# Patient Record
Sex: Female | Born: 2000 | Race: White | Hispanic: No | Marital: Single | Smoking: Never smoker
Health system: Southern US, Community
[De-identification: ages and names within clinical notes are randomized; demographics above are authoritative.]

## PROBLEM LIST (undated history)

## (undated) DIAGNOSIS — J45909 Unspecified asthma, uncomplicated: Secondary | ICD-10-CM

---
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
## 2020-01-23 DIAGNOSIS — N915 Oligomenorrhea, unspecified: Secondary | ICD-10-CM | POA: Diagnosis not present

## 2020-01-28 ENCOUNTER — Ambulatory Visit: Payer: Medicaid Other

## 2020-01-31 ENCOUNTER — Ambulatory Visit (INDEPENDENT_AMBULATORY_CARE_PROVIDER_SITE_OTHER): Payer: Medicaid Other | Admitting: Family Medicine

## 2020-01-31 ENCOUNTER — Other Ambulatory Visit: Payer: Self-pay

## 2020-01-31 ENCOUNTER — Other Ambulatory Visit (HOSPITAL_COMMUNITY)
Admission: RE | Admit: 2020-01-31 | Discharge: 2020-01-31 | Disposition: A | Discharge status: Home / Self Care | Payer: Medicaid Other | Source: Ambulatory Visit | Attending: Family Medicine | Admitting: Family Medicine

## 2020-01-31 VITALS — BP 98/58 | HR 94 | Ht 65.0 in | Wt 111.0 lb

## 2020-01-31 DIAGNOSIS — Z7251 High risk heterosexual behavior: Secondary | ICD-10-CM

## 2020-01-31 DIAGNOSIS — Z1159 Encounter for screening for other viral diseases: Secondary | ICD-10-CM

## 2020-01-31 DIAGNOSIS — Z113 Encounter for screening for infections with a predominantly sexual mode of transmission: Secondary | ICD-10-CM | POA: Insufficient documentation

## 2020-01-31 DIAGNOSIS — R300 Dysuria: Secondary | ICD-10-CM

## 2020-01-31 LAB — POCT URINALYSIS DIP (MANUAL ENTRY)
Bilirubin, UA: NEGATIVE
Blood, UA: NEGATIVE
Glucose, UA: NEGATIVE mg/dL
Ketones, POC UA: NEGATIVE mg/dL
Leukocytes, UA: NEGATIVE
Nitrite, UA: NEGATIVE
Protein Ur, POC: NEGATIVE mg/dL
Spec Grav, UA: >=1.03 — AB (ref 1.010–1.025)
Urobilinogen, UA: 0.2 E.U./dL
pH, UA: 5.5 (ref 5.0–8.0)

## 2020-01-31 LAB — POCT WET PREP (WET MOUNT)
Clue Cells Wet Prep Whiff POC: NEGATIVE
Trichomonas Wet Prep HPF POC: ABSENT

## 2020-01-31 LAB — POCT URINE PREGNANCY: Preg Test, Ur: NEGATIVE

## 2020-01-31 NOTE — Assessment & Plan Note (Addendum)
Wet prep is negative for trichomonas.  Gonorrhea and chlamydia pending.  She denied oral and anal swabs.  HIV and RPR also ordered today.  We'll follow up on these results.  Could consider PrEP in the future if she is interested she is engaging in high-risk sexual behavior.

## 2020-01-31 NOTE — Assessment & Plan Note (Signed)
Pregnancy test is negative today.  Advised patient that this does not rule out pregnancy especially from her unprotected intercourse this morning.  Given a bag of condoms before leaving.  Advised her to follow-up if she wants to discuss forms of contraception, especially to do when she is on her period to ensure that she is not pregnant.  Advised her that she can follow-up for Plan B at the pharmacy if she would like for emergency contraception.

## 2020-01-31 NOTE — Progress Notes (Signed)
SUBJECTIVE:   CHIEF COMPLAINT / HPI:   Dysuria Pain urinating started 2 weeks ago. Pain is: burning at the end of urination Medications tried: none Any antibiotics in the last 30 days: none More than 3 UTIs in the last 12 months: no STD exposure: unsure, wants to be tested Possibly pregnant: she is having unprotected intercourse, last this morning LMP 10/8  Symptoms Urgency: none Frequency: none Blood in urine: none Pain in back: none Fever: none Vaginal discharge: normal Mouth Ulcers: none  Swab for G/C, Wet prep Declines oral and anal swab Wants HIV/RPR    PERTINENT  PMH / PSH: Hx Depressed mood  OBJECTIVE:   BP (!) 98/58   Pulse 94   Ht 5\' 5"  (1.651 m)   Wt 111 lb (50.3 kg)   LMP 01/10/2020   SpO2 97%   BMI 18.47 kg/m    Physical Exam:  General: 19 y.o. female in NAD Lungs: No increased WOB on RA Abdomen: Soft, non-tender to palpation Skin: warm and dry Extremities: No edema GU: Pelvic exam performed with patient supine.  Chaperone in room.  Bilateral labia without abnormalities, no inguinal LAD palpated.  Cervix exhibits white, thin discharge, no cervix abnormalities.  No vaginal lesions.  Vaginal discharge thin, white/yellow.    Results for orders placed or performed in visit on 01/31/20 (from the past 24 hour(s))  POCT urinalysis dipstick     Status: Abnormal   Collection Time: 01/31/20  9:20 AM  Result Value Ref Range   Color, UA yellow yellow   Clarity, UA clear clear   Glucose, UA negative negative mg/dL   Bilirubin, UA negative negative   Ketones, POC UA negative negative mg/dL   Spec Grav, UA 02/02/20 (A) 1.010 - 1.025   Blood, UA negative negative   pH, UA 5.5 5.0 - 8.0   Protein Ur, POC negative negative mg/dL   Urobilinogen, UA 0.2 0.2 or 1.0 E.U./dL   Nitrite, UA Negative Negative   Leukocytes, UA Negative Negative  POCT urine pregnancy     Status: None   Collection Time: 01/31/20  9:20 AM  Result Value Ref Range   Preg Test,  Ur Negative Negative  POCT Wet Prep 02/02/20 [place])     Status: None   Collection Time: 01/31/20  9:25 AM  Result Value Ref Range   Source Wet Prep POC VAG    WBC, Wet Prep HPF POC 0-3    Bacteria Wet Prep HPF POC Few Few   Clue Cells Wet Prep HPF POC None None   Clue Cells Wet Prep Whiff POC Negative Whiff    Yeast Wet Prep HPF POC None None   KOH Wet Prep POC None None   Trichomonas Wet Prep HPF POC Absent Absent     ASSESSMENT/PLAN:   Dysuria Nonspecific symptoms. Does not have any signs of systemic infection.  Given that her symptoms are nonspecific and her UA is negative, will not treat for UTI.  She was given return precautions including worsening in her pain, no improvement in her pain, development of fever, development of CVA tenderness.  She voiced understanding.  Screen for sexually transmitted diseases Wet prep is negative for trichomonas.  Gonorrhea and chlamydia pending.  She denied oral and anal swabs.  HIV and RPR also ordered today.  We'll follow up on these results.  Could consider PrEP in the future if she is interested she is engaging in high-risk sexual behavior.  Unprotected sexual intercourse Pregnancy test is negative  today.  Advised patient that this does not rule out pregnancy especially from her unprotected intercourse this morning.  Given a bag of condoms before leaving.  Advised her to follow-up if she wants to discuss forms of contraception, especially to do when she is on her period to ensure that she is not pregnant.  Advised her that she can follow-up for Plan B at the pharmacy if she would like for emergency contraception.     Unknown Jim, DO [facility]

## 2020-01-31 NOTE — Patient Instructions (Addendum)
Thank you for coming to see me today. It was a pleasure. Today we talked about:   We performed STD testing today. This will take a few days to come back. If your MyChart is activated, we will message you on there if everything is normal, otherwise we will call. If we need to treat something we will also call you. If you do not hear from [place] in the next 4 days, please give [place] a call.  Please remember to use protection and come back if you want birth control (preferably when you start your period).  Plan B is available over the counter if you would like to get this.  If you have any questions or concerns, please do not hesitate to call the office at [phone].  Best,   Luis Abed, DO

## 2020-01-31 NOTE — Assessment & Plan Note (Signed)
Nonspecific symptoms. Does not have any signs of systemic infection.  Given that her symptoms are nonspecific and her UA is negative, will not treat for UTI.  She was given return precautions including worsening in her pain, no improvement in her pain, development of fever, development of CVA tenderness.  She voiced understanding.

## 2020-02-01 ENCOUNTER — Other Ambulatory Visit: Payer: Self-pay | Admitting: Family Medicine

## 2020-02-01 DIAGNOSIS — A749 Chlamydial infection, unspecified: Secondary | ICD-10-CM

## 2020-02-01 LAB — CERVICOVAGINAL ANCILLARY ONLY
Chlamydia: POSITIVE — AB
Comment: NEGATIVE
Comment: NORMAL
Neisseria Gonorrhea: NEGATIVE

## 2020-02-01 MED ORDER — DOXYCYCLINE HYCLATE 100 MG PO TABS: 100.0000 mg | ORAL_TABLET | Freq: Two times a day (BID) | ORAL | 0 refills | Status: DC

## 2020-02-01 NOTE — Progress Notes (Signed)
rx for doxy

## 2020-02-04 ENCOUNTER — Telehealth: Payer: Self-pay

## 2020-02-04 NOTE — Telephone Encounter (Signed)
Noted and agree. 

## 2020-02-04 NOTE — Telephone Encounter (Signed)
Patient calls nurse line regarding results from recent office visit. Informed patient that results came back positive for chlamydia and that treatment had been sent into pharmacy. Instructed patient to abstain from sexual intercourse, to inform partner of positive results and to follow up in three months for TOC.   To PCP  Veronda Prude, RN

## 2020-02-07 ENCOUNTER — Telehealth: Payer: Self-pay

## 2020-02-07 ENCOUNTER — Other Ambulatory Visit: Payer: Self-pay | Admitting: Family Medicine

## 2020-02-07 DIAGNOSIS — A749 Chlamydial infection, unspecified: Secondary | ICD-10-CM

## 2020-02-07 MED ORDER — AZITHROMYCIN 500 MG PO TABS: 1000.0000 mg | ORAL_TABLET | Freq: Once | ORAL | 0 refills | Status: AC

## 2020-02-07 NOTE — Progress Notes (Signed)
Could not tolerate doxycycline due to diarrhea.  Azithromycin prescribed.

## 2020-02-07 NOTE — Telephone Encounter (Signed)
Patient calls nurse line regarding vomiting related to doxycycline. Patient is requesting one time dosage of azithromycin. Patient states that this has worked well for her in the past.   To PCP  Please advise  Veronda Prude, RN

## 2020-02-07 NOTE — Telephone Encounter (Signed)
Rx sent for azithromycin.

## 2020-02-08 ENCOUNTER — Telehealth: Payer: Self-pay

## 2020-02-08 NOTE — Telephone Encounter (Signed)
-----   Message from Unknown Jim, DO sent at 02/01/2020  4:11 PM EDT ----- Attempted to call patient, no answer, left VM to call back.  Rx sent for doxy.  Team, can we make sure this is reported to health dept?

## 2020-02-08 NOTE — Telephone Encounter (Signed)
Form filled out. Put up front to be faxed. Sunday Spillers, CMA

## 2020-02-12 ENCOUNTER — Encounter: Payer: Self-pay | Admitting: Family Medicine

## 2020-05-23 ENCOUNTER — Ambulatory Visit: Payer: Medicaid Other | Admitting: Family Medicine

## 2020-05-23 NOTE — Progress Notes (Deleted)
    SUBJECTIVE:   CHIEF COMPLAINT / HPI:   Contraceptive Counseling Sexually active: *** LMP: *** Intercourse in the last 2 weeks: *** Attempting to conceive: *** Requests STI testing: *** History of STI: *** Last Pap: N/A given age Personal or family history of DVT/PE: ***   PERTINENT  PMH / PSH: ***  OBJECTIVE:   There were no vitals taken for this visit.   Physical Exam: General: In NAD Respiratory: Breathing comfortably on room air GU: Pelvic exam performed with patient supine.  Chaperone in room.  Bilateral labia without abnormalities.  Cervix exhibits *** discharge, no cervix abnormalities.  No vaginal lesions.  Vaginal discharge ***.   ASSESSMENT/PLAN:   No problem-specific Assessment & Plan notes found for this encounter.     Unknown Jim, DO [facility]

## 2020-05-27 NOTE — Progress Notes (Unsigned)
    SUBJECTIVE:   CHIEF COMPLAINT / HPI:   Contraceptive Counseling Sexually active: yes, she is with a new partner LMP: Patient's last menstrual period was 05/12/2020. Intercourse in the last 2 weeks: yes Attempting to conceive: no Requests STI testing: yes History of STI: yes, chlamydia Last Pap: N/A given age Personal or family history of DVT/PE: no Has previously had IUD Wanted to start pills before, but never did Has had unprotected sex in last 72 hrs and would like emergency contraception, does not want IUD again   PERTINENT  PMH / PSH: Hx depressed mood  OBJECTIVE:   BP (!) 96/58   Pulse 66   Wt 104 lb (47.2 kg)   LMP 05/12/2020   SpO2 94%   BMI 17.31 kg/m    Physical Exam: General: In NAD Respiratory: Breathing comfortably on room air GU: Pelvic exam performed with patient supine.  Chaperone in room.  Bilateral labia without abnormalities.  Cervix exhibits no discharge, no cervix abnormalities.  No vaginal lesions.  Vaginal discharge scant white/yellow, thin.   ASSESSMENT/PLAN:   Encounter for initial prescription of contraceptive pills The risks and benefits were dicussed with patient regarding the following forms of birth control: oral contraceptive pills as she had previously been counseled on all methods.  The patient decided to proceed with OCPs.  She was counseled on possible side effects of this method and will come back in 2-4 weeks for a urine pregnancy test to ensure no pregnancy.  She denies a history of migraines with aura, DVT, PE, and family history of DVT or PE.   Screening for STDs (sexually transmitted diseases) Throat and vagina swabbed for G/C today.  Trichomonas also added to G/C swab from vagina.  HIV/RPR obtained.  Will f/u results.  Recommended condom use.  Emergency contraception Has had unprotected intercourse in last 72 hrs, therefore offered emergency contraception, which patient agreed to.  Rx sent for Plan B.  Advised that she can  start birth control pills immediately with this method, but can take OCP starting tomorrow.  Did advise backup contraception within the next 7 days and condom use indefinitely to prevent STDs.     Unknown Jim, DO [facility]

## 2020-05-28 ENCOUNTER — Other Ambulatory Visit: Payer: Self-pay

## 2020-05-28 ENCOUNTER — Encounter: Payer: Self-pay | Admitting: Family Medicine

## 2020-05-28 ENCOUNTER — Other Ambulatory Visit (HOSPITAL_COMMUNITY)
Admission: RE | Admit: 2020-05-28 | Discharge: 2020-05-28 | Disposition: A | Discharge status: Home / Self Care | Payer: Medicaid Other | Source: Ambulatory Visit | Attending: Family Medicine | Admitting: Family Medicine

## 2020-05-28 ENCOUNTER — Ambulatory Visit (INDEPENDENT_AMBULATORY_CARE_PROVIDER_SITE_OTHER): Payer: Medicaid Other | Admitting: Family Medicine

## 2020-05-28 VITALS — BP 96/58 | HR 66 | Wt 104.0 lb

## 2020-05-28 DIAGNOSIS — N898 Other specified noninflammatory disorders of vagina: Secondary | ICD-10-CM | POA: Diagnosis not present

## 2020-05-28 DIAGNOSIS — Z30011 Encounter for initial prescription of contraceptive pills: Secondary | ICD-10-CM | POA: Diagnosis not present

## 2020-05-28 DIAGNOSIS — Z789 Other specified health status: Secondary | ICD-10-CM | POA: Diagnosis not present

## 2020-05-28 DIAGNOSIS — Z30012 Encounter for prescription of emergency contraception: Secondary | ICD-10-CM | POA: Insufficient documentation

## 2020-05-28 DIAGNOSIS — Z113 Encounter for screening for infections with a predominantly sexual mode of transmission: Secondary | ICD-10-CM | POA: Insufficient documentation

## 2020-05-28 LAB — POCT URINE PREGNANCY: Preg Test, Ur: NEGATIVE

## 2020-05-28 MED ORDER — LEVONORGESTREL 1.5 MG PO TABS: 1.5000 mg | ORAL_TABLET | Freq: Once | ORAL | 0 refills | Status: AC

## 2020-05-28 MED ORDER — NORGESTIMATE-ETH ESTRADIOL 0.25-35 MG-MCG PO TABS: 1.0000 | ORAL_TABLET | Freq: Every day | ORAL | 11 refills | Status: DC

## 2020-05-28 NOTE — Assessment & Plan Note (Signed)
Throat and vagina swabbed for G/C today.  Trichomonas also added to G/C swab from vagina.  HIV/RPR obtained.  Will f/u results.  Recommended condom use.

## 2020-05-28 NOTE — Assessment & Plan Note (Signed)
The risks and benefits were dicussed with patient regarding the following forms of birth control: oral contraceptive pills as she had previously been counseled on all methods.  The patient decided to proceed with OCPs.  She was counseled on possible side effects of this method and will come back in 2-4 weeks for a urine pregnancy test to ensure no pregnancy.  She denies a history of migraines with aura, DVT, PE, and family history of DVT or PE.

## 2020-05-28 NOTE — Patient Instructions (Signed)
Thank you for coming to see me today. It was a pleasure. Today we talked about:   We performed STD testing today. This will take a few days to come back. If your MyChart is activated, we will message you on there if everything is normal, otherwise we will call. If we need to treat something we will also call you. If you do not hear from [place] in the next 4 days, please give [place] a call.  Come back in 2-4 weeks for another pregnancy test.  You can start your pills tonight.  Take at night, the same time every night.  Use backup contraception (condoms) for 7 days at least, but I recommend always using them to prevent STDs.  Go to the pharmacy right away to get your emergency contraception.   If you have any questions or concerns, please do not hesitate to call the office at [phone].  Best,   Luis Abed, DO

## 2020-05-28 NOTE — Assessment & Plan Note (Signed)
Has had unprotected intercourse in last 72 hrs, therefore offered emergency contraception, which patient agreed to.  Rx sent for Plan B.  Advised that she can start birth control pills immediately with this method, but can take OCP starting tomorrow.  Did advise backup contraception within the next 7 days and condom use indefinitely to prevent STDs.

## 2020-05-29 ENCOUNTER — Telehealth: Payer: Self-pay | Admitting: *Deleted

## 2020-05-29 ENCOUNTER — Encounter: Payer: Self-pay | Admitting: Family Medicine

## 2020-05-29 ENCOUNTER — Other Ambulatory Visit: Payer: Self-pay | Admitting: Family Medicine

## 2020-05-29 DIAGNOSIS — A749 Chlamydial infection, unspecified: Secondary | ICD-10-CM

## 2020-05-29 LAB — RPR: RPR Ser Ql: NONREACTIVE

## 2020-05-29 LAB — CERVICOVAGINAL ANCILLARY ONLY
Chlamydia: NEGATIVE
Chlamydia: POSITIVE — AB
Comment: NEGATIVE
Comment: NEGATIVE
Comment: NEGATIVE
Comment: NORMAL
Comment: NORMAL
Neisseria Gonorrhea: NEGATIVE
Neisseria Gonorrhea: NEGATIVE
Trichomonas: NEGATIVE

## 2020-05-29 LAB — HIV ANTIBODY (ROUTINE TESTING W REFLEX): HIV Screen 4th Generation wRfx: NONREACTIVE

## 2020-05-29 MED ORDER — DOXYCYCLINE HYCLATE 100 MG PO TABS: 100.0000 mg | ORAL_TABLET | Freq: Two times a day (BID) | ORAL | 0 refills | Status: DC

## 2020-05-29 NOTE — Telephone Encounter (Signed)
Faxed Communicable Disease Report to Health Dept. Tiffany Patrick, CMA

## 2020-06-03 ENCOUNTER — Other Ambulatory Visit: Payer: Self-pay | Admitting: Family Medicine

## 2020-06-03 DIAGNOSIS — A749 Chlamydial infection, unspecified: Secondary | ICD-10-CM

## 2020-06-03 MED ORDER — AZITHROMYCIN 500 MG PO TABS: 1000.0000 mg | ORAL_TABLET | Freq: Once | ORAL | 0 refills | Status: AC

## 2020-06-03 NOTE — Progress Notes (Signed)
Couldn't tolerate doxy.

## 2020-09-29 ENCOUNTER — Ambulatory Visit: Payer: Medicaid Other | Admitting: Family Medicine

## 2020-09-29 NOTE — Progress Notes (Deleted)
    SUBJECTIVE:   CHIEF COMPLAINT / HPI:    PERTINENT  PMH / PSH:   OBJECTIVE:   There were no vitals taken for this visit.  ***  ASSESSMENT/PLAN:   No problem-specific Assessment & Plan notes found for this encounter.     Unknown Jim, DO [facility]   {    This will disappear when note is signed, click to select method of visit    :1}

## 2020-10-03 DIAGNOSIS — Z681 Body mass index (BMI) 19 or less, adult: Secondary | ICD-10-CM | POA: Diagnosis not present

## 2020-10-03 DIAGNOSIS — Z3200 Encounter for pregnancy test, result unknown: Secondary | ICD-10-CM | POA: Diagnosis not present

## 2020-10-03 DIAGNOSIS — N926 Irregular menstruation, unspecified: Secondary | ICD-10-CM | POA: Diagnosis not present

## 2020-10-03 DIAGNOSIS — Z7251 High risk heterosexual behavior: Secondary | ICD-10-CM | POA: Diagnosis not present

## 2020-12-20 DIAGNOSIS — Z20822 Contact with and (suspected) exposure to covid-19: Secondary | ICD-10-CM | POA: Diagnosis not present

## 2020-12-23 DIAGNOSIS — U071 COVID-19: Secondary | ICD-10-CM | POA: Diagnosis not present

## 2021-01-01 DIAGNOSIS — Z1159 Encounter for screening for other viral diseases: Secondary | ICD-10-CM | POA: Diagnosis not present

## 2021-01-01 DIAGNOSIS — E559 Vitamin D deficiency, unspecified: Secondary | ICD-10-CM | POA: Diagnosis not present

## 2021-01-01 DIAGNOSIS — R0602 Shortness of breath: Secondary | ICD-10-CM | POA: Diagnosis not present

## 2021-01-01 DIAGNOSIS — Z131 Encounter for screening for diabetes mellitus: Secondary | ICD-10-CM | POA: Diagnosis not present

## 2021-01-01 DIAGNOSIS — R5383 Other fatigue: Secondary | ICD-10-CM | POA: Diagnosis not present

## 2021-01-01 DIAGNOSIS — Z13220 Encounter for screening for lipoid disorders: Secondary | ICD-10-CM | POA: Diagnosis not present

## 2021-01-01 DIAGNOSIS — Z3200 Encounter for pregnancy test, result unknown: Secondary | ICD-10-CM | POA: Diagnosis not present

## 2021-01-01 DIAGNOSIS — Z0000 Encounter for general adult medical examination without abnormal findings: Secondary | ICD-10-CM | POA: Diagnosis not present

## 2021-01-01 DIAGNOSIS — R110 Nausea: Secondary | ICD-10-CM | POA: Diagnosis not present

## 2021-01-02 DIAGNOSIS — Z3201 Encounter for pregnancy test, result positive: Secondary | ICD-10-CM | POA: Diagnosis not present

## 2021-01-15 DIAGNOSIS — E559 Vitamin D deficiency, unspecified: Secondary | ICD-10-CM | POA: Diagnosis not present

## 2021-01-15 DIAGNOSIS — Z681 Body mass index (BMI) 19 or less, adult: Secondary | ICD-10-CM | POA: Diagnosis not present

## 2021-01-15 DIAGNOSIS — Z3491 Encounter for supervision of normal pregnancy, unspecified, first trimester: Secondary | ICD-10-CM | POA: Diagnosis not present

## 2021-01-20 DIAGNOSIS — Z3491 Encounter for supervision of normal pregnancy, unspecified, first trimester: Secondary | ICD-10-CM | POA: Diagnosis not present

## 2021-01-20 DIAGNOSIS — O3680X1 Pregnancy with inconclusive fetal viability, fetus 1: Secondary | ICD-10-CM | POA: Diagnosis not present

## 2021-01-20 DIAGNOSIS — N912 Amenorrhea, unspecified: Secondary | ICD-10-CM | POA: Diagnosis not present

## 2021-01-20 LAB — OB RESULTS CONSOLE GC/CHLAMYDIA
Chlamydia: NEGATIVE
Gonorrhea: NEGATIVE

## 2021-01-20 LAB — OB RESULTS CONSOLE RPR: RPR: NONREACTIVE

## 2021-01-20 LAB — OB RESULTS CONSOLE HIV ANTIBODY (ROUTINE TESTING): HIV: NONREACTIVE

## 2021-01-20 LAB — OB RESULTS CONSOLE HEPATITIS B SURFACE ANTIGEN: Hepatitis B Surface Ag: NEGATIVE

## 2021-01-21 DIAGNOSIS — Z3491 Encounter for supervision of normal pregnancy, unspecified, first trimester: Secondary | ICD-10-CM | POA: Diagnosis not present

## 2021-02-04 DIAGNOSIS — O3680X9 Pregnancy with inconclusive fetal viability, other fetus: Secondary | ICD-10-CM | POA: Diagnosis not present

## 2021-02-04 DIAGNOSIS — Z113 Encounter for screening for infections with a predominantly sexual mode of transmission: Secondary | ICD-10-CM | POA: Diagnosis not present

## 2021-02-04 DIAGNOSIS — Z3A12 12 weeks gestation of pregnancy: Secondary | ICD-10-CM | POA: Diagnosis not present

## 2021-02-04 DIAGNOSIS — N912 Amenorrhea, unspecified: Secondary | ICD-10-CM | POA: Diagnosis not present

## 2021-02-04 DIAGNOSIS — N925 Other specified irregular menstruation: Secondary | ICD-10-CM | POA: Diagnosis not present

## 2021-04-10 DIAGNOSIS — Z3A20 20 weeks gestation of pregnancy: Secondary | ICD-10-CM | POA: Diagnosis not present

## 2021-04-10 DIAGNOSIS — Z3686 Encounter for antenatal screening for cervical length: Secondary | ICD-10-CM | POA: Diagnosis not present

## 2021-04-10 DIAGNOSIS — Z363 Encounter for antenatal screening for malformations: Secondary | ICD-10-CM | POA: Diagnosis not present

## 2021-04-10 DIAGNOSIS — D649 Anemia, unspecified: Secondary | ICD-10-CM | POA: Diagnosis not present

## 2021-04-12 NOTE — L&D Delivery Note (Signed)
Delivery Note ?At 2:58 PM a viable female was delivered via Vaginal, Spontaneous (Presentation: Left Occiput Anterior).  APGAR: 9, 9; weight pending .   ?Placenta status: Spontaneous, Intact.  Cord: 3 vessels with the following complications: None.  Cord pH: n/a ? ?Anesthesia:   ?Episiotomy: None ?Lacerations: 2nd degree;Perineal and bilateral labial and periurethral ?Suture Repair: 2.0 3.0 vicryl ?Est. Blood Loss (mL):   ? ?Mom to postpartum.  Baby to Couplet care / Skin to Skin. ? ?Tiffany Patrick ?08/22/2021, 3:34 PM ? ? ? ?

## 2021-05-08 DIAGNOSIS — Z363 Encounter for antenatal screening for malformations: Secondary | ICD-10-CM | POA: Diagnosis not present

## 2021-05-08 DIAGNOSIS — Z3A24 24 weeks gestation of pregnancy: Secondary | ICD-10-CM | POA: Diagnosis not present

## 2021-05-08 DIAGNOSIS — R110 Nausea: Secondary | ICD-10-CM | POA: Diagnosis not present

## 2021-05-08 DIAGNOSIS — J45909 Unspecified asthma, uncomplicated: Secondary | ICD-10-CM | POA: Diagnosis not present

## 2021-05-08 DIAGNOSIS — Z331 Pregnant state, incidental: Secondary | ICD-10-CM | POA: Diagnosis not present

## 2021-05-08 DIAGNOSIS — K219 Gastro-esophageal reflux disease without esophagitis: Secondary | ICD-10-CM | POA: Diagnosis not present

## 2021-05-08 DIAGNOSIS — D649 Anemia, unspecified: Secondary | ICD-10-CM | POA: Diagnosis not present

## 2021-05-29 DIAGNOSIS — Z369 Encounter for antenatal screening, unspecified: Secondary | ICD-10-CM | POA: Diagnosis not present

## 2021-06-26 DIAGNOSIS — Z3A31 31 weeks gestation of pregnancy: Secondary | ICD-10-CM | POA: Diagnosis not present

## 2021-06-26 DIAGNOSIS — Z363 Encounter for antenatal screening for malformations: Secondary | ICD-10-CM | POA: Diagnosis not present

## 2021-08-05 DIAGNOSIS — Z363 Encounter for antenatal screening for malformations: Secondary | ICD-10-CM | POA: Diagnosis not present

## 2021-08-05 DIAGNOSIS — Z681 Body mass index (BMI) 19 or less, adult: Secondary | ICD-10-CM | POA: Diagnosis not present

## 2021-08-05 DIAGNOSIS — Z113 Encounter for screening for infections with a predominantly sexual mode of transmission: Secondary | ICD-10-CM | POA: Diagnosis not present

## 2021-08-05 DIAGNOSIS — Z3A37 37 weeks gestation of pregnancy: Secondary | ICD-10-CM | POA: Diagnosis not present

## 2021-08-05 DIAGNOSIS — Z369 Encounter for antenatal screening, unspecified: Secondary | ICD-10-CM | POA: Diagnosis not present

## 2021-08-22 ENCOUNTER — Other Ambulatory Visit: Payer: Self-pay

## 2021-08-22 ENCOUNTER — Encounter (HOSPITAL_COMMUNITY): Payer: Self-pay

## 2021-08-22 ENCOUNTER — Inpatient Hospital Stay (HOSPITAL_COMMUNITY)
Admission: AD | Admit: 2021-08-22 | Discharge: 2021-08-24 | DRG: 807 | Disposition: A | Discharge status: Home / Self Care | Payer: Medicaid Other | Attending: Obstetrics and Gynecology | Admitting: Obstetrics and Gynecology

## 2021-08-22 ENCOUNTER — Inpatient Hospital Stay (HOSPITAL_COMMUNITY): Payer: Medicaid Other | Admitting: Anesthesiology

## 2021-08-22 DIAGNOSIS — O26893 Other specified pregnancy related conditions, third trimester: Principal | ICD-10-CM | POA: Diagnosis present

## 2021-08-22 DIAGNOSIS — Z3A39 39 weeks gestation of pregnancy: Secondary | ICD-10-CM

## 2021-08-22 LAB — CBC
HCT: 34.2 % — ABNORMAL LOW (ref 36.0–46.0)
Hemoglobin: 10.7 g/dL — ABNORMAL LOW (ref 12.0–15.0)
MCH: 22.1 pg — ABNORMAL LOW (ref 26.0–34.0)
MCHC: 31.3 g/dL (ref 30.0–36.0)
MCV: 70.7 fL — ABNORMAL LOW (ref 80.0–100.0)
Platelets: 255 10*3/uL (ref 150–400)
RBC: 4.84 MIL/uL (ref 3.87–5.11)
RDW: 19.5 % — ABNORMAL HIGH (ref 11.5–15.5)
WBC: 17.4 10*3/uL — ABNORMAL HIGH (ref 4.0–10.5)
nRBC: 0 % (ref 0.0–0.2)

## 2021-08-22 LAB — TYPE AND SCREEN
ABO/RH(D): O POS
Antibody Screen: NEGATIVE

## 2021-08-22 MED ORDER — DIPHENHYDRAMINE HCL 25 MG PO CAPS: 25.0000 mg | ORAL_CAPSULE | Freq: Four times a day (QID) | ORAL | Status: DC | PRN

## 2021-08-22 MED ORDER — LIDOCAINE HCL (PF) 1 % IJ SOLN
30.0000 mL | INTRAMUSCULAR | Status: AC | PRN
  Administered 2021-08-22: 30 mL via SUBCUTANEOUS

## 2021-08-22 MED ORDER — COCONUT OIL OIL
1.0000 | TOPICAL_OIL | Status: DC | PRN
Start: 2021-08-22 — End: 2021-08-24
  Administered 2021-08-23: 1 via TOPICAL

## 2021-08-22 MED ORDER — LACTATED RINGERS IV SOLN: INTRAVENOUS | Status: DC

## 2021-08-22 MED ORDER — ONDANSETRON HCL 4 MG/2ML IJ SOLN: 4.0000 mg | Freq: Four times a day (QID) | INTRAMUSCULAR | Status: DC | PRN

## 2021-08-22 MED ORDER — OXYTOCIN BOLUS FROM INFUSION
333.0000 mL | Freq: Once | INTRAVENOUS | Status: AC
  Administered 2021-08-22: 333 mL via INTRAVENOUS

## 2021-08-22 MED ORDER — TETANUS-DIPHTH-ACELL PERTUSSIS 5-2.5-18.5 LF-MCG/0.5 IM SUSY: 0.5000 mL | PREFILLED_SYRINGE | Freq: Once | INTRAMUSCULAR | Status: DC

## 2021-08-22 MED ORDER — ZOLPIDEM TARTRATE 5 MG PO TABS: 5.0000 mg | ORAL_TABLET | Freq: Every evening | ORAL | Status: DC | PRN

## 2021-08-22 MED ORDER — SIMETHICONE 80 MG PO CHEW: 80.0000 mg | CHEWABLE_TABLET | ORAL | Status: DC | PRN

## 2021-08-22 MED ORDER — WITCH HAZEL-GLYCERIN EX PADS: 1.0000 "application " | MEDICATED_PAD | CUTANEOUS | Status: DC | PRN

## 2021-08-22 MED ORDER — LACTATED RINGERS IV SOLN
500.0000 mL | Freq: Once | INTRAVENOUS | Status: AC
  Administered 2021-08-22: 500 mL via INTRAVENOUS

## 2021-08-22 MED ORDER — LIDOCAINE HCL (PF) 1 % IJ SOLN
INTRAMUSCULAR | Status: DC | PRN
  Administered 2021-08-22: 3 mL via EPIDURAL
  Administered 2021-08-22: 5 mL via EPIDURAL
  Administered 2021-08-22: 2 mL via EPIDURAL

## 2021-08-22 MED ORDER — ONDANSETRON HCL 4 MG/2ML IJ SOLN: 4.0000 mg | INTRAMUSCULAR | Status: DC | PRN

## 2021-08-22 MED ORDER — FENTANYL-BUPIVACAINE-NACL 0.5-0.125-0.9 MG/250ML-% EP SOLN
12.0000 mL/h | EPIDURAL | Status: DC | PRN
  Administered 2021-08-22: 12 mL/h via EPIDURAL
  Filled 2021-08-22: qty 250

## 2021-08-22 MED ORDER — OXYTOCIN-SODIUM CHLORIDE 30-0.9 UT/500ML-% IV SOLN: 2.5000 [IU]/h | INTRAVENOUS | Status: DC | PRN

## 2021-08-22 MED ORDER — BENZOCAINE-MENTHOL 20-0.5 % EX AERO
1.0000 "application " | INHALATION_SPRAY | CUTANEOUS | Status: DC | PRN
  Administered 2021-08-22 – 2021-08-24 (×2): 1 via TOPICAL
  Filled 2021-08-22 (×2): qty 56

## 2021-08-22 MED ORDER — PHENYLEPHRINE 80 MCG/ML (10ML) SYRINGE FOR IV PUSH (FOR BLOOD PRESSURE SUPPORT): 80.0000 ug | PREFILLED_SYRINGE | INTRAVENOUS | Status: DC | PRN

## 2021-08-22 MED ORDER — PRENATAL MULTIVITAMIN CH
1.0000 | ORAL_TABLET | Freq: Every day | ORAL | Status: DC
  Administered 2021-08-23 – 2021-08-24 (×2): 1 via ORAL
  Filled 2021-08-22 (×2): qty 1

## 2021-08-22 MED ORDER — FENTANYL CITRATE (PF) 100 MCG/2ML IJ SOLN: 50.0000 ug | INTRAMUSCULAR | Status: DC | PRN

## 2021-08-22 MED ORDER — EPHEDRINE 5 MG/ML INJ: 10.0000 mg | INTRAVENOUS | Status: DC | PRN

## 2021-08-22 MED ORDER — OXYCODONE-ACETAMINOPHEN 5-325 MG PO TABS: 2.0000 | ORAL_TABLET | ORAL | Status: DC | PRN

## 2021-08-22 MED ORDER — LACTATED RINGERS IV SOLN: 500.0000 mL | Freq: Once | INTRAVENOUS | Status: DC

## 2021-08-22 MED ORDER — PHENYLEPHRINE 80 MCG/ML (10ML) SYRINGE FOR IV PUSH (FOR BLOOD PRESSURE SUPPORT)
80.0000 ug | PREFILLED_SYRINGE | INTRAVENOUS | Status: DC | PRN
  Filled 2021-08-22: qty 10

## 2021-08-22 MED ORDER — ERYTHROMYCIN 5 MG/GM OP OINT
TOPICAL_OINTMENT | OPHTHALMIC | Status: AC
  Filled 2021-08-22: qty 1

## 2021-08-22 MED ORDER — OXYCODONE HCL 5 MG PO TABS: 5.0000 mg | ORAL_TABLET | ORAL | Status: DC | PRN

## 2021-08-22 MED ORDER — OXYCODONE HCL 5 MG PO TABS: 10.0000 mg | ORAL_TABLET | ORAL | Status: DC | PRN

## 2021-08-22 MED ORDER — OXYCODONE-ACETAMINOPHEN 5-325 MG PO TABS: 1.0000 | ORAL_TABLET | ORAL | Status: DC | PRN

## 2021-08-22 MED ORDER — IBUPROFEN 600 MG PO TABS
600.0000 mg | ORAL_TABLET | Freq: Four times a day (QID) | ORAL | Status: DC
  Administered 2021-08-22 – 2021-08-24 (×5): 600 mg via ORAL
  Filled 2021-08-22 (×5): qty 1

## 2021-08-22 MED ORDER — DIBUCAINE (PERIANAL) 1 % EX OINT: 1.0000 "application " | TOPICAL_OINTMENT | CUTANEOUS | Status: DC | PRN

## 2021-08-22 MED ORDER — DIPHENHYDRAMINE HCL 50 MG/ML IJ SOLN: 12.5000 mg | INTRAMUSCULAR | Status: DC | PRN

## 2021-08-22 MED ORDER — SENNOSIDES-DOCUSATE SODIUM 8.6-50 MG PO TABS
2.0000 | ORAL_TABLET | Freq: Every day | ORAL | Status: DC
  Administered 2021-08-23 – 2021-08-24 (×2): 2 via ORAL
  Filled 2021-08-22 (×2): qty 2

## 2021-08-22 MED ORDER — ACETAMINOPHEN 325 MG PO TABS: 650.0000 mg | ORAL_TABLET | ORAL | Status: DC | PRN

## 2021-08-22 MED ORDER — OXYTOCIN-SODIUM CHLORIDE 30-0.9 UT/500ML-% IV SOLN
2.5000 [IU]/h | INTRAVENOUS | Status: DC
  Administered 2021-08-22: 2.5 [IU]/h via INTRAVENOUS
  Filled 2021-08-22: qty 500

## 2021-08-22 MED ORDER — LACTATED RINGERS IV SOLN: 500.0000 mL | INTRAVENOUS | Status: DC | PRN

## 2021-08-22 MED ORDER — SOD CITRATE-CITRIC ACID 500-334 MG/5ML PO SOLN: 30.0000 mL | ORAL | Status: DC | PRN

## 2021-08-22 MED ORDER — ONDANSETRON HCL 4 MG PO TABS: 4.0000 mg | ORAL_TABLET | ORAL | Status: DC | PRN

## 2021-08-22 NOTE — Plan of Care (Signed)
Pt demonstrated understanding 

## 2021-08-22 NOTE — Anesthesia Preprocedure Evaluation (Signed)
Anesthesia Evaluation  ?Patient identified by MRN, date of birth, ID band ?Patient awake ? ? ? ?Reviewed: ?Allergy & Precautions, NPO status , Patient's Chart, lab work & pertinent test results ? ?Airway ?Mallampati: II ? ?TM Distance: >3 FB ?Neck ROM: Full ? ? ? Dental ? ?(+) Teeth Intact, Dental Advisory Given ?  ?Pulmonary ?asthma ,  ?  ?Pulmonary exam normal ?breath sounds clear to auscultation ? ? ? ? ? ? Cardiovascular ?negative cardio ROS ?Normal cardiovascular exam ?Rhythm:Regular Rate:Normal ? ? ?  ?Neuro/Psych ?negative neurological ROS ?   ? GI/Hepatic ?negative GI ROS, Neg liver ROS,   ?Endo/Other  ?negative endocrine ROS ? Renal/GU ?negative Renal ROS  ? ?  ?Musculoskeletal ?negative musculoskeletal ROS ?(+)  ? Abdominal ?  ?Peds ? Hematology ? ?(+) Blood dyscrasia, anemia , Plt 255k   ?Anesthesia Other Findings ?Day of surgery medications reviewed with the patient. ? Reproductive/Obstetrics ?(+) Pregnancy ? ?  ? ? ? ? ? ? ? ? ? ? ? ? ? ?  ?  ? ? ? ? ? ? ? ? ?Anesthesia Physical ?Anesthesia Plan ? ?ASA: 2 ? ?Anesthesia Plan: Epidural  ? ?Post-op Pain Management:   ? ?Induction:  ? ?PONV Risk Score and Plan: 2 and Treatment may vary due to age or medical condition ? ?Airway Management Planned: Natural Airway ? ?Additional Equipment:  ? ?Intra-op Plan:  ? ?Post-operative Plan:  ? ?Informed Consent: I have reviewed the patients History and Physical, chart, labs and discussed the procedure including the risks, benefits and alternatives for the proposed anesthesia with the patient or authorized representative who has indicated his/her understanding and acceptance.  ? ? ? ?Dental advisory given ? ?Plan Discussed with:  ? ?Anesthesia Plan Comments: (Patient identified. Risks/Benefits/Options discussed with patient including but not limited to bleeding, infection, nerve damage, paralysis, failed block, incomplete pain control, headache, blood pressure changes, nausea, vomiting,  reactions to medication both or allergic, itching and postpartum back pain. Confirmed with bedside nurse the patient's most recent platelet count. Confirmed with patient that they are not currently taking any anticoagulation, have any bleeding history or any family history of bleeding disorders. Patient expressed understanding and wished to proceed. All questions were answered. )  ? ? ? ? ? ? ?Anesthesia Quick Evaluation ? ?

## 2021-08-22 NOTE — Progress Notes (Signed)
BP's high at 1319 and 1327 due to pt movement and pain ?

## 2021-08-22 NOTE — Lactation Note (Signed)
This note was copied from a baby's chart. ?Lactation Consultation Note ? ?Patient Name: Tiffany Patrick ?LFYBO'F Date: 08/22/2021 ?Reason for consult: L&D Initial assessment;Primapara;1st time breastfeeding;Term ?Age:21 hours ? ?L&D consult with >60 minutes old infant and P1 mother. Congratulated family on newborn. Assisted with hand expression and spoonfed 2-mL of EBM. Offered assistance with latch, laid back position, still latched upon leaving room.   ? ?Discussed STS as ideal transition for infants after birth. Talked about primal reflexes. Explained LC services availability during postpartum stay. Thanked family for their time.   ? ? ? ?Maternal Data ?Has patient been taught Hand Expression?: Yes ?Does the patient have breastfeeding experience prior to this delivery?: No ? ?Feeding ?Mother's Current Feeding Choice: Breast Milk and Formula ? ?LATCH Score ?Latch: Repeated attempts needed to sustain latch, nipple held in mouth throughout feeding, stimulation needed to elicit sucking reflex. ? ?Audible Swallowing: A few with stimulation ? ?Type of Nipple: Everted at rest and after stimulation (short shafted) ? ?Comfort (Breast/Nipple): Soft / non-tender ? ?Hold (Positioning): Assistance needed to correctly position infant at breast and maintain latch. ? ?LATCH Score: 7 ? ? ?Interventions ?Interventions: Assisted with latch;Skin to skin;Hand express;Breast massage;Expressed milk;Education ? ?Discharge ?Pump: Personal ?WIC Program: Yes ? ?Consult Status ?Consult Status: Follow-up from L&D ?Date: 08/22/21 ?Follow-up type: In-patient ? ? ? ?Tena Linebaugh A Higuera Ancidey ?08/22/2021, 4:20 PM ? ? ? ?

## 2021-08-22 NOTE — MAU Note (Signed)
.  Tiffany Patrick is a 21 y.o. at [redacted]w[redacted]d here in MAU reporting: ctx that started around 0730 this morning. Denies VB or LOF. DFM today. Ctx are now 3-4 minutes apart.  ? ?Pain score: 8 ?Vitals:  ? 08/22/21 1153  ?BP: 128/65  ?Pulse: 90  ?Resp: 15  ?Temp: 98.4 ?F (36.9 ?C)  ?SpO2: 98%  ?   ?FHT:141 ? ?

## 2021-08-22 NOTE — Anesthesia Procedure Notes (Signed)
Epidural ?Patient location during procedure: OB ?Start time: 08/22/2021 1:11 PM ?End time: 08/22/2021 1:18 PM ? ?Staffing ?Anesthesiologist: Collene Schlichter, MD ?Performed: anesthesiologist  ? ?Preanesthetic Checklist ?Completed: patient identified, IV checked, risks and benefits discussed, monitors and equipment checked, pre-op evaluation and timeout performed ? ?Epidural ?Patient position: sitting ?Prep: DuraPrep ?Patient monitoring: blood pressure and continuous pulse ox ?Approach: midline ?Location: L3-L4 ?Injection technique: LOR air ? ?Needle:  ?Needle type: Tuohy  ?Needle gauge: 17 G ?Needle length: 9 cm ?Needle insertion depth: 4 cm ?Catheter size: 19 Gauge ?Catheter at skin depth: 9 cm ?Test dose: negative and Other (1% Lidocaine) ? ?Additional Notes ?Patient identified.  Risk benefits discussed including failed block, incomplete pain control, headache, nerve damage, paralysis, blood pressure changes, nausea, vomiting, reactions to medication both toxic or allergic, and postpartum back pain.  Patient expressed understanding and wished to proceed.  All questions were answered.  Sterile technique used throughout procedure and epidural site dressed with sterile barrier dressing. No paresthesia or other complications noted. The patient did not experience any signs of intravascular injection such as tinnitus or metallic taste in mouth nor signs of intrathecal spread such as rapid motor block. Please see nursing notes for vital signs. ?Reason for block:procedure for pain ? ? ? ?

## 2021-08-22 NOTE — H&P (Signed)
Tiffany Patrick is a 21 y.o. female presenting for labor. ? ?OB History   ? ? Gravida  ?1  ? Para  ?0  ? Term  ?0  ? Preterm  ?0  ? AB  ?0  ? Living  ?0  ?  ? ? SAB  ?0  ? IAB  ?0  ? Ectopic  ?0  ? Multiple  ?0  ? Live Births  ?0  ?   ?  ?  ? ?Past Medical History:  ?Diagnosis Date  ? Asthma   ? ?History reviewed. No pertinent surgical history. ?Family History: family history includes Healthy in her father and mother. ?Social History:  reports that she has never smoked. She has been exposed to tobacco smoke. She has never used smokeless tobacco. She reports that she does not drink alcohol and does not use drugs. ? ? ?  ?Maternal Diabetes: No ?Genetic Screening: Normal ?Maternal Ultrasounds/Referrals: Normal ?Fetal Ultrasounds or other Referrals:  None ?Maternal Substance Abuse:  No ?Significant Maternal Medications:  None ?Significant Maternal Lab Results:  Group B Strep negative ?Other Comments:  None ? ?Review of Systems ?Denies f/c/n/v/d ? ?History ?Dilation: 3.5 ?Effacement (%): 90 ?Station: -2 ?Exam by:: Leafy Ro, RN ?Blood pressure 127/71, pulse 80, temperature 98.4 ?F (36.9 ?C), temperature source Oral, resp. rate 16, height 5\' 5"  (1.651 m), weight 64 kg, SpO2 98 %. ?Exam ?Physical Exam  ?Prenatal labs: ?ABO, Rh:  O + ?Antibody:  negative ?Rubella:  Immune ?RPR:   NR ?HBsAg:   Negative ?HIV:   NR ?GBS:   Negative ? ?Assessment/Plan: ?P0 at 13 6/7wks presenting in labor.  Membranes are intact.  Pain medicine ordered as needed.  Cat 1 tracing.  ? ?24 ?08/22/2021, 12:34 PM ? ? ? ? ?

## 2021-08-23 ENCOUNTER — Encounter (HOSPITAL_COMMUNITY): Payer: Self-pay | Admitting: Obstetrics and Gynecology

## 2021-08-23 LAB — CBC
HCT: 31.4 % — ABNORMAL LOW (ref 36.0–46.0)
Hemoglobin: 9.5 g/dL — ABNORMAL LOW (ref 12.0–15.0)
MCH: 21.7 pg — ABNORMAL LOW (ref 26.0–34.0)
MCHC: 30.3 g/dL (ref 30.0–36.0)
MCV: 71.7 fL — ABNORMAL LOW (ref 80.0–100.0)
Platelets: 260 10*3/uL (ref 150–400)
RBC: 4.38 MIL/uL (ref 3.87–5.11)
RDW: 19.2 % — ABNORMAL HIGH (ref 11.5–15.5)
WBC: 13.3 10*3/uL — ABNORMAL HIGH (ref 4.0–10.5)
nRBC: 0 % (ref 0.0–0.2)

## 2021-08-23 LAB — BIRTH TISSUE RECOVERY COLLECTION (PLACENTA DONATION)

## 2021-08-23 LAB — RPR: RPR Ser Ql: NONREACTIVE

## 2021-08-23 MED ORDER — IBUPROFEN 600 MG PO TABS: 600.0000 mg | ORAL_TABLET | Freq: Four times a day (QID) | ORAL | 1 refills | Status: DC | PRN

## 2021-08-23 MED ORDER — OXYCODONE HCL 5 MG PO TABS: 5.0000 mg | ORAL_TABLET | Freq: Four times a day (QID) | ORAL | 0 refills | Status: DC | PRN

## 2021-08-23 MED ORDER — MEDROXYPROGESTERONE ACETATE 150 MG/ML IM SUSP
150.0000 mg | Freq: Once | INTRAMUSCULAR | Status: AC
  Administered 2021-08-24: 150 mg via INTRAMUSCULAR
  Filled 2021-08-23: qty 1

## 2021-08-23 NOTE — Anesthesia Postprocedure Evaluation (Signed)
Anesthesia Post Note ? ?Patient: Tammey Tunney ? ?Procedure(s) Performed: AN AD HOC LABOR EPIDURAL ? ?  ? ?Patient location during evaluation: Mother Baby ?Anesthesia Type: Epidural ?Level of consciousness: awake and alert ?Pain management: pain level controlled ?Vital Signs Assessment: post-procedure vital signs reviewed and stable ?Respiratory status: spontaneous breathing, nonlabored ventilation and respiratory function stable ?Cardiovascular status: stable ?Postop Assessment: no headache, no backache, epidural receding, no apparent nausea or vomiting, patient able to bend at knees, adequate PO intake and able to ambulate ?Anesthetic complications: no ? ? ?No notable events documented. ? ?Last Vitals:  ?Vitals:  ? 08/22/21 2200 08/23/21 0500  ?BP: 120/74 118/80  ?Pulse: 81 79  ?Resp: 18 18  ?Temp: 36.9 ?C 36.9 ?C  ?SpO2: 99%   ?  ?Last Pain:  ?Vitals:  ? 08/23/21 0906  ?TempSrc:   ?PainSc: 5   ? ?Pain Goal:   ? ?  ?  ?  ?  ?  ?  ?  ? ?Vedant Shehadeh Hristova ? ? ? ? ?

## 2021-08-23 NOTE — Lactation Note (Signed)
This note was copied from a baby's chart. ?Lactation Consultation Note ? ?Patient Name: Tiffany Patrick ?PIRJJ'O Date: 08/23/2021 ?Reason for consult: Initial assessment;Term;Primapara;1st time breastfeeding ?Age:21 hours ? ?Initial visit to 24 hours old infant of a P1 mother. Mother requests assistance with latch. LC demonstrated alignment, support pillows, and hand expression. Infant latched well and encouraged to maintain a deep latch and practice chin tug for depth. Demonstrated hand pump use and collected 2-mL of EBM, in addition to 2-mL with hand expression. Discussed normal newborn behavior and patterns, signs of good milk transfer, hunger cues, tummy size and benefits of skin to skin.  ? ?Plan: ?1-Deep, comfortable latch and breastfeeding on demand or 8-12 times in 24h period. ?2-Encouraged maternal rest, hydration and food intake.  ?3-Contact LC as needed for feeds/support/concerns/questions ?  ?All questions answered at this time. Provided Lactation services brochure and promoted [employer] booklet information.    ? ?Maternal Data ?Has patient been taught Hand Expression?: Yes ?Does the patient have breastfeeding experience prior to this delivery?: No ? ?Feeding ?Mother's Current Feeding Choice: Breast Milk and Formula ? ?LATCH Score ?Latch: Grasps breast easily, tongue down, lips flanged, rhythmical sucking. ? ?Audible Swallowing: Spontaneous and intermittent ? ?Type of Nipple: Everted at rest and after stimulation (short shafted) ? ?Comfort (Breast/Nipple): Filling, red/small blisters or bruises, mild/mod discomfort ? ?Hold (Positioning): Assistance needed to correctly position infant at breast and maintain latch. ? ?LATCH Score: 8 ? ? ?Lactation Tools Discussed/Used ?Tools: Pump;Flanges ?Flange Size: 21 ?Breast pump type: Manual ?Pump Education: Milk Storage;Setup, frequency, and cleaning ?Reason for Pumping: stimulation and supplementation ?Pumping frequency: as needed ?Pumped volume: 2  mL ? ?Interventions ?Interventions: Breast feeding basics reviewed;Assisted with latch;Skin to skin;Breast massage;Hand express;Breast compression;Hand pump;Expressed milk;Coconut oil;Support pillows;Adjust position;Position options;Education;LC Services brochure ? ?Discharge ?Pump: Personal;Manual ?WIC Program: Yes ? ?Consult Status ?Consult Status: Follow-up ?Date: 08/24/21 ?Follow-up type: In-patient ? ? ? ?Shermika Balthaser A Higuera Ancidey ?08/23/2021, 3:17 PM ? ? ? ?

## 2021-08-23 NOTE — Progress Notes (Signed)
CSW received consult for hx of marijuana use.  Referral was screened out due to the following: °~MOB had no documented substance use after initial prenatal visit/+UPT. °~MOB had no positive drug screens after initial prenatal visit/+UPT. °~Baby's UDS is negative. ° °Please consult CSW if current concerns arise or by MOB's request. ° °CSW will monitor CDS results and make report to Child Protective Services if warranted. ° °Makhayla Mcmurry Boyd-Gilyard, MSW, LCSW °Clinical Social Work °[phone] ° °

## 2021-08-23 NOTE — Discharge Summary (Addendum)
Physician Discharge Summary  ?Patient ID: ?Tiffany Patrick ?MRN: [id number] ?DOB/AGE: 06-03-2000 20 y.o. ? ?Admit date: 08/22/2021 ?Discharge date: 08/24/2021 ? ?Admission Diagnoses: ?Labor at Term ? ?Discharge Diagnoses:  ?Principal Problem: ?  Normal labor ? ? ?Discharged Condition: good ? ?Hospital Course: Doing well PPD1 and ready to go home. ? ?Consults: None ? ?Significant Diagnostic Studies: labs: 9.5 ? ?Treatments: routine PP care ? ?Discharge Exam: ?Blood pressure 110/70, pulse 100, temperature 98.5 ?F (36.9 ?C), resp. rate 18, height 5\' 5"  (1.651 m), weight 64 kg, SpO2 100 %. ?General appearance: alert and no distress ?Resp: clear to auscultation bilaterally ?Cardio: regular rate and rhythm ?GI: soft, NT, FF ?Extremities: extremities normal, atraumatic, no cyanosis or edema ?Normal lochia ? ?Disposition: Discharge disposition: 01-Home or Self Care ? ? ? ? ? ? ?Discharge Instructions   ? ? Call MD for:   Complete by: As directed ?  ? Abnormal foul smelling vaginal discharge or heavy vaginal bleeding (soaking a pad an hour) ?Go to hospital immediately if you have any signs or symptoms of post partum severe preeclampsia including: severe, unrelenting headache, blurry vision, spots in your vision, blood pressure greater than or equal to 160/110  ? Diet - low sodium heart healthy   Complete by: As directed ?  ? Discharge instructions   Complete by: As directed ?  ? If you have not made a follow up visit please call Central office at [phone] or if you have any questions or concerns.  ? ?  ? ?Allergies as of 08/23/2021   ?No Known Allergies ?  ? ?  ?Medication List  ?  ? ?STOP taking these medications   ? ?ferrous sulfate 325 (65 FE) MG tablet ?  ?norgestimate-ethinyl estradiol 0.25-35 MG-MCG tablet ?Commonly known as: Sprintec 28 ?  ?prenatal multivitamin Tabs tablet ?  ? ?  ? ?TAKE these medications   ? ?ibuprofen 600 MG tablet ?Commonly known as: ADVIL ?Take 1 tablet (600 mg total) by mouth every 6  (six) hours as needed. ?  ?oxyCODONE 5 MG immediate release tablet ?Commonly known as: Oxy IR/ROXICODONE ?Take 1 tablet (5 mg total) by mouth every 6 (six) hours as needed (pain scale 4-7). ? ?Ferrous Sulfate 325mg  by mouth every other day ?Colace 1 tab by mouth 1 to 3 times per day as needed for constipation  ? ?  ? ? Follow-up Information   ? ? 12-27-1998, MD Follow up.   ?Specialty: Obstetrics and Gynecology ?Contact information: ?[address] ?[address] ?[place] ?[phone] ? ? ?  ?  ? ?  ?  ? ?  ? ? ?Signed: ?[id number] ?08/24/2021, 12:23 PM ? ? ?

## 2021-08-24 NOTE — Lactation Note (Signed)
This note was copied from a baby's chart. ?Lactation Consultation Note ? ?Patient Name: Tiffany Patrick ?AOZHY'Q Date: 08/24/2021 ?Reason for consult: Follow-up assessment;1st time breastfeeding;Primapara;Nipple pain/trauma ?Age:21 hours ? ?LC in to visit with P1 Mom of term baby on day of discharge.  Baby at 6.8% weight loss with great output.  Mom became concerned that baby wasn't latching well, nipple soreness.  She offered baby one bottle of formula which baby only took 5 ml and "didn't seem to like". ? ?Teaching and reassurance provided.  LC offered a latch assist/assessment on the breast.  Mom agreed. ? ?Reviewed hand expression, milk transitioning and dripping from nipple.  Breasts are heavier this am.  Baby placed on left breast in football hold.  Baby latched with guidance from [facility], deeply onto breast.  Mom aware of chin tug to flange lips.  Baby sucked/swallowed for 10 mins with a ratio of 1:1.  Breast softened and nipple rounded after feeding.  Mom burped baby with assistance and latched baby for a brief snack on right breast.  Baby contented after feeding.  Mom feeling much more reassured after this [facility] consult. ? ?Encouraged STS with baby, watching her for cues and offer the breast. ? ?Engorgement prevention and treatment reviewed. ? ?Mom aware of OP Lactation support available.  Recommended OP lactation appt, and offered to send a message to clinic.  Mom will call.   ?Reviewed hand pump with Mom as she stated that it hurt too much. ? ?Mom to call prn for concerns.   ?  ? ?LATCH Score ?Latch: Grasps breast easily, tongue down, lips flanged, rhythmical sucking. ? ?Audible Swallowing: Spontaneous and intermittent ? ?Type of Nipple: Everted at rest and after stimulation ? ?Comfort (Breast/Nipple): Filling, red/small blisters or bruises, mild/mod discomfort ? ?Hold (Positioning): Assistance needed to correctly position infant at breast and maintain latch. ? ?LATCH Score: 8 ? ? ?Interventions ?Interventions:  Breast feeding basics reviewed;Assisted with latch;Skin to skin;Breast massage;Hand express;Breast compression;Adjust position;Support pillows;Position options;Hand pump ? ?Discharge ?Discharge Education: Engorgement and breast care;Warning signs for feeding baby;Outpatient recommendation ?Pump: Manual ? ?Consult Status ?Consult Status: Complete (mother declined follow up) ?Date: 08/24/21 ?Follow-up type: Call as needed ? ? ? ?Johny Blamer E ?08/24/2021, 9:35 AM ? ? ? ?

## 2021-08-31 ENCOUNTER — Telehealth (HOSPITAL_COMMUNITY): Payer: Self-pay | Admitting: *Deleted

## 2021-08-31 NOTE — Telephone Encounter (Signed)
Attempted hospital discharge follow-up call. No answer received. Deforest Hoyles, RN, 08/31/21, [phone]

## 2022-03-14 ENCOUNTER — Ambulatory Visit: Payer: Medicaid Other

## 2023-05-26 ENCOUNTER — Emergency Department (HOSPITAL_COMMUNITY)
Admission: EM | Admit: 2023-05-26 | Discharge: 2023-05-26 | Disposition: A | Discharge status: Home / Self Care | Payer: Medicaid Other | Attending: Emergency Medicine | Admitting: Emergency Medicine

## 2023-05-26 ENCOUNTER — Other Ambulatory Visit: Payer: Self-pay

## 2023-05-26 ENCOUNTER — Encounter (HOSPITAL_COMMUNITY): Payer: Self-pay | Admitting: *Deleted

## 2023-05-26 DIAGNOSIS — J111 Influenza due to unidentified influenza virus with other respiratory manifestations: Secondary | ICD-10-CM

## 2023-05-26 DIAGNOSIS — R059 Cough, unspecified: Secondary | ICD-10-CM | POA: Diagnosis present

## 2023-05-26 DIAGNOSIS — J45909 Unspecified asthma, uncomplicated: Secondary | ICD-10-CM | POA: Insufficient documentation

## 2023-05-26 DIAGNOSIS — J101 Influenza due to other identified influenza virus with other respiratory manifestations: Secondary | ICD-10-CM | POA: Insufficient documentation

## 2023-05-26 DIAGNOSIS — Z20822 Contact with and (suspected) exposure to covid-19: Secondary | ICD-10-CM | POA: Insufficient documentation

## 2023-05-26 LAB — RESP PANEL BY RT-PCR (RSV, FLU A&B, COVID)  RVPGX2
Influenza A by PCR: POSITIVE — AB
Influenza B by PCR: NEGATIVE
Resp Syncytial Virus by PCR: NEGATIVE
SARS Coronavirus 2 by RT PCR: NEGATIVE

## 2023-05-26 MED ORDER — ONDANSETRON 4 MG PO TBDP: ORAL_TABLET | ORAL | 0 refills | Status: DC

## 2023-05-26 MED ORDER — ONDANSETRON 4 MG PO TBDP
4.0000 mg | ORAL_TABLET | Freq: Once | ORAL | Status: AC
  Administered 2023-05-26: 4 mg via ORAL
  Filled 2023-05-26: qty 1

## 2023-05-26 MED ORDER — BENZONATATE 100 MG PO CAPS: 100.0000 mg | ORAL_CAPSULE | Freq: Three times a day (TID) | ORAL | 0 refills | Status: DC

## 2023-05-26 NOTE — Discharge Instructions (Signed)
Take tylenol 2 pills 4 times a day and motrin 4 pills 3 times a day.  Drink plenty of fluids.  Return for worsening shortness of breath, headache, confusion. Follow up with your family doctor.

## 2023-05-26 NOTE — ED Triage Notes (Signed)
Here by POV from home for flu + at home: endorses, NV, cough, fever, chills, HA, body aches, ear ache. Sx onset Monday. Highest temp 100.2 Rates pain 8/10. Alert, NAD, calm, interactive, resps e/u, speaking in clear complete sentences.

## 2023-05-26 NOTE — ED Provider Notes (Signed)
[place] EMERGENCY DEPARTMENT AT [facility] Provider Note   CSN: [id number] Arrival date & time: 05/26/23  0745     History  Chief Complaint  Patient presents with   Illness    Tiffany Patrick is a 23 y.o. female.  23 yo F with a chief complaints of cough congestion fevers chills myalgias nausea vomiting ear pain going on for the past couple days.  Her boyfriend is also sick.  She has a history of asthma.  Denies smoking.   Illness      Home Medications Prior to Admission medications   Medication Sig Start Date End Date Taking? Authorizing Provider  benzonatate (TESSALON) 100 MG capsule Take 1 capsule (100 mg total) by mouth every 8 (eight) hours. 05/26/23  Yes Melene Plan, DO  ondansetron (ZOFRAN-ODT) 4 MG disintegrating tablet 4mg  ODT q4 hours prn nausea/vomit 05/26/23  Yes Melene Plan, DO  ibuprofen (ADVIL) 600 MG tablet Take 1 tablet (600 mg total) by mouth every 6 (six) hours as needed. 08/23/21   [place], MD  oxyCODONE (OXY IR/ROXICODONE) 5 MG immediate release tablet Take 1 tablet (5 mg total) by mouth every 6 (six) hours as needed (pain scale 4-7). 08/23/21   [place], MD      Allergies    Patient has no known allergies.    Review of Systems   Review of Systems  Physical Exam Updated Vital Signs BP 124/83 (BP Location: Left Arm)   Pulse 85   Temp 100 F (37.8 C) (Oral)   Resp 18   Ht 5\' 5"  (1.651 m)   Wt 45.4 kg   LMP 05/24/2023 (Exact Date)   SpO2 100%   BMI 16.64 kg/m  Physical Exam Vitals and nursing note reviewed.  Constitutional:      General: She is not in acute distress.    Appearance: She is well-developed. She is not diaphoretic.     Comments: Low BMI  HENT:     Head: Normocephalic and atraumatic.     Comments: Swollen turbinates, posterior nasal drip, tm normal bilaterally.   Eyes:     Pupils: Pupils are equal, round, and reactive to light.  Cardiovascular:     Rate and Rhythm: Normal rate and regular rhythm.      Heart sounds: No murmur heard.    No friction rub. No gallop.  Pulmonary:     Effort: Pulmonary effort is normal.     Breath sounds: No wheezing or rales.  Abdominal:     General: There is no distension.     Palpations: Abdomen is soft.     Tenderness: There is no abdominal tenderness.  Musculoskeletal:        General: No tenderness.     Cervical back: Normal range of motion and neck supple.  Skin:    General: Skin is warm and dry.  Neurological:     Mental Status: She is alert and oriented to person, place, and time.  Psychiatric:        Behavior: Behavior normal.     ED Results / Procedures / Treatments   Labs (all labs ordered are listed, but only abnormal results are displayed) Labs Reviewed  RESP PANEL BY RT-PCR (RSV, FLU A&B, COVID)  [id number]    EKG None  Radiology No results found.  Procedures Procedures    Medications Ordered in ED Medications  ondansetron (ZOFRAN-ODT) disintegrating tablet 4 mg (has no administration in time range)    ED Course/ Medical Decision Making/ A&P  Medical Decision Making Risk Prescription drug management.   23 yo F with a chief complaints of cough congestion fever chills myalgias ear pain sore throat headache going on for a couple days.  Patient is well-appearing nontoxic.  Clear lung sounds for me.  Benign abdominal exam.  No tachypnea do not feel that she would benefit from chest imaging.  Most likely viral syndrome.  Will treat supportively.  PCP follow-up.  8:45 AM:  I have discussed the diagnosis/risks/treatment options with the patient and family.  Evaluation and diagnostic testing in the emergency department does not suggest an emergent condition requiring admission or immediate intervention beyond what has been performed at this time.  They will follow up with PCP. We also discussed returning to the ED immediately if new or worsening sx occur. We discussed the sx which are most concerning  (e.g., sudden worsening pain, fever, inability to tolerate by mouth) that necessitate immediate return. Medications administered to the patient during their visit and any new prescriptions provided to the patient are listed below.  Medications given during this visit Medications  ondansetron (ZOFRAN-ODT) disintegrating tablet 4 mg (has no administration in time range)     The patient appears reasonably screen and/or stabilized for discharge and I doubt any other medical condition or other [facility] requiring further screening, evaluation, or treatment in the ED at this time prior to discharge.          Final Clinical Impression(s) / ED Diagnoses Final diagnoses:  Influenza-like illness    Rx / DC Orders ED Discharge Orders          Ordered    benzonatate (TESSALON) 100 MG capsule  Every 8 hours        05/26/23 0837    ondansetron (ZOFRAN-ODT) 4 MG disintegrating tablet        05/26/23 0837              Melene Plan, DO 05/26/23 0845

## 2024-04-12 NOTE — L&D Delivery Note (Signed)
 Delivery Note:   G2P1001 at [redacted]w[redacted]d  Admitting diagnosis: Normal labor and delivery [O80] Normal labor [O80, Z37.9] Risks: GBS bacteriuria; anemia, on PO iron  Onset of labor: 04/29/2024 at 1700 IOL/Augmentation: N/A ROM: 04/30/2024 at 0023  Complete dilation at 04/30/2024 0015 Onset of pushing at 0023 FHR second stage Cat II, variable decels  Analgesia/Anesthesia intrapartum:None Pushing in lithotomy position with CNM and L&D staff support, Partner, Arden, present for birth and supportive.  Delivery of a Live born female  Birth Weight: pending  APGAR: 8, 9  Newborn Delivery   Birth date/time: 04/30/2024 00:24:00 Delivery type: Vaginal, Spontaneous    in cephalic presentation, position OA to LOA.  APGAR:1 min-8 , 5 min-9   Nuchal Cord: Yes  Cord double clamped after cessation of pulsation, cut by [facility].  Collection of cord blood for typing completed. Arterial cord blood sample-No   Placenta delivered-Spontaneous with 3 vessels. Uterotonics: Pitocin  Placenta to L&D Uterine tone firm  Bleeding small  None laceration identified.  Episiotomy:None Local analgesia: N/A  Repair: N/A Est. Blood Loss (mL):175.00  Complications: None  Mom to postpartum. Baby Arden Raddle. to Couplet care / Skin to Skin.  Delivery Report:   Review the Delivery Report for details.    Alan MARLA Molt, CNM, MSN 04/30/2024, 1:05 AM

## 2024-04-29 ENCOUNTER — Encounter (HOSPITAL_COMMUNITY): Payer: Self-pay

## 2024-04-29 ENCOUNTER — Other Ambulatory Visit: Payer: Self-pay

## 2024-04-29 ENCOUNTER — Inpatient Hospital Stay (HOSPITAL_COMMUNITY)
Admission: AD | Admit: 2024-04-29 | Discharge: 2024-05-01 | Disposition: A | Discharge status: Home / Self Care | Attending: Obstetrics and Gynecology | Admitting: Obstetrics and Gynecology

## 2024-04-29 DIAGNOSIS — B951 Streptococcus, group B, as the cause of diseases classified elsewhere: Secondary | ICD-10-CM | POA: Diagnosis present

## 2024-04-29 DIAGNOSIS — D509 Iron deficiency anemia, unspecified: Secondary | ICD-10-CM | POA: Diagnosis present

## 2024-04-29 MED ORDER — SODIUM CHLORIDE 0.9 % IV SOLN
1.0000 g | INTRAVENOUS | Status: DC
  Filled 2024-04-29 (×3): qty 1000

## 2024-04-29 MED ORDER — LACTATED RINGERS IV SOLN: INTRAVENOUS | Status: AC

## 2024-04-29 MED ORDER — SODIUM CHLORIDE 0.9 % IV SOLN
2.0000 g | Freq: Once | INTRAVENOUS | Status: AC
  Administered 2024-04-30: 2 g via INTRAVENOUS
  Filled 2024-04-29: qty 2000

## 2024-04-29 MED ORDER — LACTATED RINGERS IV SOLN: 500.0000 mL | INTRAVENOUS | Status: AC | PRN

## 2024-04-29 NOTE — MAU Note (Signed)
 Patient is a G2P0, [redacted]w[redacted]d that presents with complaint of contractions since 1800 today. Patient denies any LOF or bleeding. +FM. VSS, BP 131/74. Monitors applied and assessing.

## 2024-04-30 ENCOUNTER — Encounter (HOSPITAL_COMMUNITY): Payer: Self-pay | Admitting: Obstetrics and Gynecology

## 2024-04-30 ENCOUNTER — Encounter (HOSPITAL_COMMUNITY): Payer: Self-pay

## 2024-04-30 DIAGNOSIS — D509 Iron deficiency anemia, unspecified: Secondary | ICD-10-CM | POA: Diagnosis present

## 2024-04-30 DIAGNOSIS — B951 Streptococcus, group B, as the cause of diseases classified elsewhere: Secondary | ICD-10-CM | POA: Diagnosis present

## 2024-04-30 LAB — CBC
HCT: 29.5 % — ABNORMAL LOW (ref 36.0–46.0)
Hemoglobin: 8.3 g/dL — ABNORMAL LOW (ref 12.0–15.0)
MCH: 18.7 pg — ABNORMAL LOW (ref 26.0–34.0)
MCHC: 28.1 g/dL — ABNORMAL LOW (ref 30.0–36.0)
MCV: 66.4 fL — ABNORMAL LOW (ref 80.0–100.0)
Platelets: 302 K/uL (ref 150–400)
RBC: 4.44 MIL/uL (ref 3.87–5.11)
RDW: 20.7 % — ABNORMAL HIGH (ref 11.5–15.5)
WBC: 15 K/uL — ABNORMAL HIGH (ref 4.0–10.5)
nRBC: 0.3 % — ABNORMAL HIGH (ref 0.0–0.2)

## 2024-04-30 LAB — TYPE AND SCREEN
ABO/RH(D): O POS
Antibody Screen: NEGATIVE

## 2024-04-30 LAB — SYPHILIS: RPR W/REFLEX TO RPR TITER AND TREPONEMAL ANTIBODIES, TRADITIONAL SCREENING AND DIAGNOSIS ALGORITHM: RPR Ser Ql: NONREACTIVE

## 2024-04-30 MED ORDER — DIBUCAINE (PERIANAL) 1 % EX OINT: 1.0000 | TOPICAL_OINTMENT | CUTANEOUS | Status: DC | PRN

## 2024-04-30 MED ORDER — ZOLPIDEM TARTRATE 5 MG PO TABS: 5.0000 mg | ORAL_TABLET | Freq: Every evening | ORAL | Status: DC | PRN

## 2024-04-30 MED ORDER — FERROUS SULFATE 325 (65 FE) MG PO TABS
325.0000 mg | ORAL_TABLET | Freq: Two times a day (BID) | ORAL | Status: DC
  Administered 2024-04-30 – 2024-05-01 (×3): 325 mg via ORAL
  Filled 2024-04-30 (×4): qty 1

## 2024-04-30 MED ORDER — LIDOCAINE HCL (PF) 1 % IJ SOLN
INTRAMUSCULAR | Status: AC
  Filled 2024-04-30: qty 5

## 2024-04-30 MED ORDER — BENZOCAINE-MENTHOL 20-0.5 % EX AERO
1.0000 | INHALATION_SPRAY | CUTANEOUS | Status: DC | PRN
  Filled 2024-04-30: qty 56

## 2024-04-30 MED ORDER — ONDANSETRON HCL 4 MG/2ML IJ SOLN: 4.0000 mg | INTRAMUSCULAR | Status: DC | PRN

## 2024-04-30 MED ORDER — FLEET ENEMA RE ENEM: 1.0000 | ENEMA | RECTAL | Status: DC | PRN

## 2024-04-30 MED ORDER — LIDOCAINE HCL (PF) 1 % IJ SOLN
INTRAMUSCULAR | Status: AC
  Filled 2024-04-30: qty 30

## 2024-04-30 MED ORDER — SOD CITRATE-CITRIC ACID 500-334 MG/5ML PO SOLN: 30.0000 mL | ORAL | Status: DC | PRN

## 2024-04-30 MED ORDER — OXYCODONE-ACETAMINOPHEN 5-325 MG PO TABS: 1.0000 | ORAL_TABLET | ORAL | Status: DC | PRN

## 2024-04-30 MED ORDER — PRENATAL MULTIVITAMIN CH
1.0000 | ORAL_TABLET | Freq: Every day | ORAL | Status: DC
  Administered 2024-04-30 – 2024-05-01 (×2): 1 via ORAL
  Filled 2024-04-30 (×2): qty 1

## 2024-04-30 MED ORDER — SENNOSIDES-DOCUSATE SODIUM 8.6-50 MG PO TABS
2.0000 | ORAL_TABLET | Freq: Every day | ORAL | Status: DC
  Administered 2024-05-01: 2 via ORAL
  Filled 2024-04-30: qty 2

## 2024-04-30 MED ORDER — TETANUS-DIPHTH-ACELL PERTUSSIS 5-2-15.5 LF-MCG/0.5 IM SUSP: 0.5000 mL | Freq: Once | INTRAMUSCULAR | Status: DC

## 2024-04-30 MED ORDER — LIDOCAINE HCL (PF) 1 % IJ SOLN: 30.0000 mL | INTRAMUSCULAR | Status: DC | PRN

## 2024-04-30 MED ORDER — OXYTOCIN BOLUS FROM INFUSION: 333.0000 mL | Freq: Once | INTRAVENOUS | Status: AC

## 2024-04-30 MED ORDER — IBUPROFEN 600 MG PO TABS
600.0000 mg | ORAL_TABLET | Freq: Four times a day (QID) | ORAL | Status: DC
  Administered 2024-04-30 – 2024-05-01 (×6): 600 mg via ORAL
  Filled 2024-04-30 (×6): qty 1

## 2024-04-30 MED ORDER — ACETAMINOPHEN 325 MG PO TABS
650.0000 mg | ORAL_TABLET | ORAL | Status: DC | PRN
  Administered 2024-04-30: 650 mg via ORAL
  Filled 2024-04-30: qty 2

## 2024-04-30 MED ORDER — COCONUT OIL OIL
1.0000 | TOPICAL_OIL | Status: DC | PRN
  Administered 2024-04-30: 1 via TOPICAL

## 2024-04-30 MED ORDER — SIMETHICONE 80 MG PO CHEW: 80.0000 mg | CHEWABLE_TABLET | ORAL | Status: DC | PRN

## 2024-04-30 MED ORDER — ONDANSETRON HCL 4 MG/2ML IJ SOLN: 4.0000 mg | Freq: Four times a day (QID) | INTRAMUSCULAR | Status: DC | PRN

## 2024-04-30 MED ORDER — DIPHENHYDRAMINE HCL 25 MG PO CAPS: 25.0000 mg | ORAL_CAPSULE | Freq: Four times a day (QID) | ORAL | Status: DC | PRN

## 2024-04-30 MED ORDER — OXYTOCIN-SODIUM CHLORIDE 30-0.9 UT/500ML-% IV SOLN
INTRAVENOUS | Status: AC
  Administered 2024-04-30: 333 mL via INTRAVENOUS
  Filled 2024-04-30: qty 500

## 2024-04-30 MED ORDER — ONDANSETRON HCL 4 MG PO TABS: 4.0000 mg | ORAL_TABLET | ORAL | Status: DC | PRN

## 2024-04-30 MED ORDER — ACETAMINOPHEN 325 MG PO TABS: 650.0000 mg | ORAL_TABLET | ORAL | Status: DC | PRN

## 2024-04-30 MED ORDER — WITCH HAZEL-GLYCERIN EX PADS: 1.0000 | MEDICATED_PAD | CUTANEOUS | Status: DC | PRN

## 2024-04-30 MED ORDER — OXYTOCIN 10 UNIT/ML IJ SOLN
INTRAMUSCULAR | Status: AC
  Filled 2024-04-30: qty 1

## 2024-04-30 MED ORDER — OXYTOCIN-SODIUM CHLORIDE 30-0.9 UT/500ML-% IV SOLN
2.5000 [IU]/h | INTRAVENOUS | Status: DC
  Administered 2024-04-30: 2.5 [IU]/h via INTRAVENOUS

## 2024-04-30 MED ORDER — OXYCODONE-ACETAMINOPHEN 5-325 MG PO TABS: 2.0000 | ORAL_TABLET | ORAL | Status: DC | PRN

## 2024-04-30 NOTE — Lactation Note (Addendum)
 This note was copied from a baby's chart. Lactation Consultation Note  Patient Name: Tiffany Patrick Date: 04/30/2024 Age:24 hours  Reason for consult: Initial assessment;Term  P2, [redacted]w[redacted]d, delivered in MAU   Initial LC visit with mother and baby Tiffany. She reports baby is breastfeeding well. She has experience breastfeeding her first child (daughter) for 6 months and she had a good milk supply. She has experienced and managed engorgement when she was weaning her daughter. She denies any questions or concerns regarding breast feeding.  Mother encouraged to latch baby with feeding cues, place baby skin to skin if not latching, and call for assistance with breastfeeding as needed. Informed mother regarding cluster feeding as baby approaches 24 hours of age. Baby should  increase feeding frequency, breastfeeding 8-12 times in 24 hours, and cluster feedings. Cluster feeding helps to stimulate milk production.    Mom made aware of O/P services, breastfeeding support groups, community resources, and phone # for post-discharge questions.    Mother is aware that she can request Lactation if services are needed. Will follow if there is a need/request.    Maternal Data Has patient been taught Hand Expression?: No Does the patient have breastfeeding experience prior to this delivery?: Yes How long did the patient breastfeed?: 6 months  Feeding Mother's Current Feeding Choice: Breast Milk  Interventions Interventions: Breast feeding basics reviewed;Education;LC Services brochure;CDC milk storage guidelines;CDC Guidelines for Breast Pump Cleaning  Discharge Discharge Education: Engorgement and breast care (informed of OP LC supprt and services at [facility] (hospital affliated)) Pump: Hands Free;Personal  Consult Status Consult Status: PRN    Tiffany Patrick HERO 04/30/2024, 5:48 PM

## 2024-04-30 NOTE — H&P (Cosign Needed)
 OB ADMISSION/ HISTORY & PHYSICAL:  Admission Date: 04/29/2024 11:13 PM  Admit Diagnosis: Normal labor and delivery [O80] Normal labor [O80, Z37.9]    Tiffany Patrick is a 24 y.o. female G2P1001 at [redacted]w[redacted]d presenting for active labor, followed by precipitous delivery of viable female in MAU. Partner, Arden, present and supportive.   Prenatal History: G2P1001   EDC: 05/06/2024  Prenatal course complicated by: GBS bacteriuria Anemia, taking PO iron  History of asthma  Prenatal Labs: ABO, Rh:   O POS Antibody:   Negative Rubella:   Immune RPR:   Non-reactive HBsAg:   Negative HIV:   Negative GBS:   Positive 1 hr Glucola : 77 Genetic Screening: Low risk Panorama Ultrasound: normal XY anatomy, posterior placenta, EFW 84%, 5lb 6oz at 33 weeks  Prenatal Transfer Tool  Maternal Diabetes: No Genetic Screening: Normal Maternal Ultrasounds/Referrals: Normal Fetal Ultrasounds or other Referrals:  None Maternal Substance Abuse:  No Significant Maternal Medications:  None Significant Maternal Lab Results: Group B Strep positive Number of Prenatal Visits:greater than 3 verified prenatal visits Maternal Vaccinations:RSV: Given during pregnancy >/=14 days ago and TDap Other Comments:  None   Medical / Surgical History : Past medical history:  Past Medical History:  Diagnosis Date   Asthma     Past surgical history: No past surgical history on file.  Family History:  Family History  Problem Relation Age of Onset   Healthy Mother    Healthy Father     Social History:  reports that she has never smoked. She has been exposed to tobacco smoke. She has never used smokeless tobacco. She reports that she does not drink alcohol and does not use drugs.  Allergies: Patient has no known allergies.   Current Medications at time of admission:  Medications Prior to Admission  Medication Sig Dispense Refill Last Dose/Taking   benzonatate  (TESSALON ) 100 MG capsule Take 1 capsule (100 mg total)  by mouth every 8 (eight) hours. 21 capsule 0    ibuprofen  (ADVIL ) 600 MG tablet Take 1 tablet (600 mg total) by mouth every 6 (six) hours as needed. 40 tablet 1    ondansetron  (ZOFRAN -ODT) 4 MG disintegrating tablet 4mg  ODT q4 hours prn nausea/vomit 20 tablet 0    oxyCODONE  (OXY IR/ROXICODONE ) 5 MG immediate release tablet Take 1 tablet (5 mg total) by mouth every 6 (six) hours as needed (pain scale 4-7). 10 tablet 0     Review of Systems: Review of Systems  All other systems reviewed and are negative.  Physical Exam: Vital signs and nursing notes reviewed.  Patient Vitals for the past 24 hrs:  BP Temp Temp src Pulse Resp SpO2 Height Weight  04/29/24 2346 -- -- -- -- -- 100 % -- --  04/29/24 2331 -- -- -- -- -- 99 % -- --  04/29/24 2330 131/74 (!) 97.5 F (36.4 C) Oral (!) 123 16 100 % -- --  04/29/24 2325 -- -- -- -- -- -- 5' 5 (1.651 m) 66.2 kg    General: AAO x 3, NAD Heart: RRR Lungs:CTAB Abdomen: Gravid, NT Extremities: no edema SVE: Dilation: 10 Effacement (%): 80 Station: -2 Presentation: Vertex Exam by:: Amy S, RN   FHR: 120BPM, moderate variability, + accels, variable decels TOCO: Contractions q 2 minutes  Labs:   Recent Labs    04/29/24 2355  WBC 15.0*  HGB 8.3*  HCT 29.5*  PLT 302   Assessment/Plan: 24 y.o. G2P1001 at [redacted]w[redacted]d, active labor and precipitous delivery in MAU of viable  female, see delivery note GBS bacteriuria Anemia, on PO iron   POC discussed with patient and support team, all questions answered.  Dr. Henry notified of admission/plan of care.  Alan MARLA Molt CNM, MSN 04/30/2024, 12:39 AM

## 2024-05-01 LAB — CBC
HCT: 27.3 % — ABNORMAL LOW (ref 36.0–46.0)
Hemoglobin: 7.8 g/dL — ABNORMAL LOW (ref 12.0–15.0)
MCH: 18.6 pg — ABNORMAL LOW (ref 26.0–34.0)
MCHC: 28.6 g/dL — ABNORMAL LOW (ref 30.0–36.0)
MCV: 65 fL — ABNORMAL LOW (ref 80.0–100.0)
Platelets: 284 K/uL (ref 150–400)
RBC: 4.2 MIL/uL (ref 3.87–5.11)
RDW: 20.6 % — ABNORMAL HIGH (ref 11.5–15.5)
WBC: 12.7 K/uL — ABNORMAL HIGH (ref 4.0–10.5)
nRBC: 0.2 % (ref 0.0–0.2)

## 2024-05-01 MED ORDER — IBUPROFEN 600 MG PO TABS: 600.0000 mg | ORAL_TABLET | Freq: Four times a day (QID) | ORAL | 0 refills | Status: AC

## 2024-05-01 MED ORDER — POLYSACCHARIDE IRON COMPLEX 150 MG PO CAPS: 150.0000 mg | ORAL_CAPSULE | Freq: Every day | ORAL | 1 refills | Status: AC

## 2024-05-01 MED ORDER — ACETAMINOPHEN 325 MG PO TABS: 650.0000 mg | ORAL_TABLET | ORAL | 1 refills | Status: AC | PRN

## 2024-05-01 NOTE — Discharge Summary (Signed)
 OB Discharge Summary  Patient Name: Tiffany Patrick DOB: 2001-03-18 MRN: [id number]  Date of admission: 04/29/2024 Delivering provider: JOSHUA PALMA K  Admitting diagnosis: Normal labor and delivery [O80] Normal labor [O80, Z37.9] Intrauterine pregnancy: [redacted]w[redacted]d     Secondary diagnosis: Patient Active Problem List   Diagnosis Date Noted   SVD (spontaneous vaginal delivery) 04/30/2024   Positive GBS test 04/30/2024   Postpartum care following vaginal delivery 1/19 04/30/2024   Anemia, iron  deficiency 04/30/2024   Normal labor 08/22/2021    Date of discharge: 05/01/2024   Discharge diagnosis: Principal Problem:   Postpartum care following vaginal delivery 1/19 Active Problems:   Normal labor   SVD (spontaneous vaginal delivery)   Positive GBS test   Anemia, iron  deficiency                                                            Augmentation: N/A Pain control: None Laceration:None Complications: None  Hospital course:  Onset of Labor With Vaginal Delivery      24 y.o. yo [id number] at [redacted]w[redacted]d was admitted in Active Labor on 04/29/2024. Labor course was complicated by precipitous labor and delivery in MAU, GBS+ and not treated   Membrane Rupture Time/Date: 12:23 AM,04/30/2024  Delivery Method:Vaginal, Spontaneous Operative Delivery:N/A Episiotomy: None Lacerations:  None Patient had an uncomplicated postpartum course. She is ambulating, tolerating a regular diet, passing flatus, and urinating well. Patient is discharged home in stable condition on 05/01/24.  Newborn Data: Birth date:04/30/2024 Birth time:12:24 AM Gender:Female Living status:Living Apgars:8 ,9  Weight:3220 g  Physical exam  Vitals:   04/30/24 2034 05/01/24 0514 05/01/24 0859 05/01/24 1432  BP: 120/76 (!) 100/57 (!) 123/56 114/65  Pulse: 77 (!) 56 76 72  Resp: 17  16 16   Temp: 98.8 F (37.1 C) 98.2 F (36.8 C) 98.3 F (36.8 C) 98 F (36.7 C)  TempSrc: Oral Oral Oral Oral  SpO2: 99%  99%   Weight:       Height:       General: alert and cooperative Lochia: appropriate Uterine Fundus: firm Perineum: intact DVT Evaluation: No evidence of DVT seen on physical exam.  Labs: Lab Results  Component Value Date   WBC 12.7 (H) 05/01/2024   HGB 7.8 (L) 05/01/2024   HCT 27.3 (L) 05/01/2024   MCV 65.0 (L) 05/01/2024   PLT 284 05/01/2024      05/01/2024    5:14 AM 08/23/2021    6:00 AM 08/22/2021   10:00 PM  Edinburgh Postnatal Depression Scale Screening Tool  I have been able to laugh and see the funny side of things. 0 0  0   I have looked forward with enjoyment to things. 0 0  0   I have blamed myself unnecessarily when things went wrong. 1 0    I have been anxious or worried for no good reason. 0 0    I have felt scared or panicky for no good reason. 1 0    Things have been getting on top of me. 0 0    I have been so unhappy that I have had difficulty sleeping. 0 0    I have felt sad or miserable. 0 0    I have been so unhappy that I have been crying. 0 0  The thought of harming myself has occurred to me. 0 0    Edinburgh Postnatal Depression Scale Total 2 0       Data saved with a previous flowsheet row definition   Discharge instructions:  per After Visit Summary  After Visit Meds:  Allergies as of 05/01/2024   No Known Allergies      Medication List     STOP taking these medications    benzonatate  100 MG capsule Commonly known as: TESSALON    ondansetron  4 MG disintegrating tablet Commonly known as: ZOFRAN -ODT   oxyCODONE  5 MG immediate release tablet Commonly known as: Oxy IR/ROXICODONE        TAKE these medications    acetaminophen  325 MG tablet Commonly known as: TYLENOL  Take 2 tablets (650 mg total) by mouth every 4 (four) hours as needed (for pain scale < 4  OR  temperature  >/=  100.5 F).   ibuprofen  600 MG tablet Commonly known as: ADVIL  Take 1 tablet (600 mg total) by mouth every 6 (six) hours. What changed:  when to take this reasons to take  this   iron  polysaccharides 150 MG capsule Commonly known as: Ferrex 150 Take 1 capsule (150 mg total) by mouth daily.       Activity: Advance as tolerated. Pelvic rest for 6 weeks.   Newborn Data: Live born female  Birth Weight: 7 lb 1.6 oz (3220 g) APGAR: 8, 9  Newborn Delivery   Birth date/time: 04/30/2024 00:24:00 Delivery type: Vaginal, Spontaneous    Named Elvie Raddle. Baby Feeding: Bottle and Breast Circumcision: Completed, Dr. Armond Disposition:home with mother  Delivery Report:  Review the Delivery Report for details.    Follow up:  Follow-up Information     [facility]. Schedule an appointment as soon as possible for a visit in 6 week(s).   Specialty: Obstetrics and Gynecology Contact information: [address] [place]  [address] [phone]               Alan MARLA Joshua EDDY, MSN 05/01/2024, 4:31 PM

## 2024-05-02 NOTE — Lactation Note (Signed)
 This note was copied from a baby's chart. Lactation Consultation Note  Patient Name: Tiffany Patrick Date: 05/02/2024 Age:24 hours  Reason for consult: Follow-up assessment;Term;Nipple pain/trauma  P2, [redacted]w[redacted]d, 55 g weight increase, 4.82% weight loss  Mother's milk is in. Breast are full, not engorged. She reports baby is feeding good with swallows. Weight gain was noted. Her nipples are sore and left nipple is scabbed on the surface. Mother is applying her expressed milk and coconut oil. She reports it is soothing and helping her nipples to heal.   Mother requested a hand pump for home use, if needed. She was fitted with a 18 mm flange. Advised about appropriate flange fitting to reduce nipple soreness and information given how to order flange inserts for her personal wearable pump.   Mother has breastfeeding experience.he denies any other questions or concerns. Baby had a large void, yellowish brown stool and drooling milk.   Mom made aware of O/P services, breastfeeding support groups, community resources, and phone # for post-discharge questions.     Feeding Mother's Current Feeding Choice: Breast Milk   Lactation Tools Discussed/Used Tools: Pump;Flanges Flange Size: 18 Breast pump type: Manual Pump Education: Setup, frequency, and cleaning;Milk Storage Reason for Pumping: mother's request Pumping frequency: as needed  Interventions Interventions: Breast feeding basics reviewed;Hand pump;Education;CDC Guidelines for Breast Pump Cleaning;CDC milk storage guidelines  Discharge Discharge Education: Engorgement and breast care;Warning signs for feeding baby Pump: DEBP;Hands Free;Personal;Manual  Consult Status Consult Status: Complete    Tiffany Patrick HERO 05/02/2024, 9:23 AM

## 2024-05-10 ENCOUNTER — Telehealth (HOSPITAL_COMMUNITY): Payer: Self-pay | Admitting: *Deleted

## 2024-05-10 NOTE — Telephone Encounter (Signed)
 Attempted hospital discharge follow-up call. Left message for patient to return RN call with any questions or concerns. Allean IVAR Carton, RN, 05/10/24, [phone]
